# Patient Record
Sex: Female | Born: 2000 | Hispanic: No | Marital: Single | Smoking: Never smoker
Health system: Southern US, Community
[De-identification: ages and names within clinical notes are randomized; demographics above are authoritative.]

---
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
## 2020-09-07 IMAGING — DX DG HAND 2V*L*
2 series · 2 of 2 positions shown · non-contrast
Comparison: None.

CLINICAL DATA: Laceration with glass at the fourth and fifth
digits.

EXAM:
LEFT HAND - 2 VIEW

[hand pa]
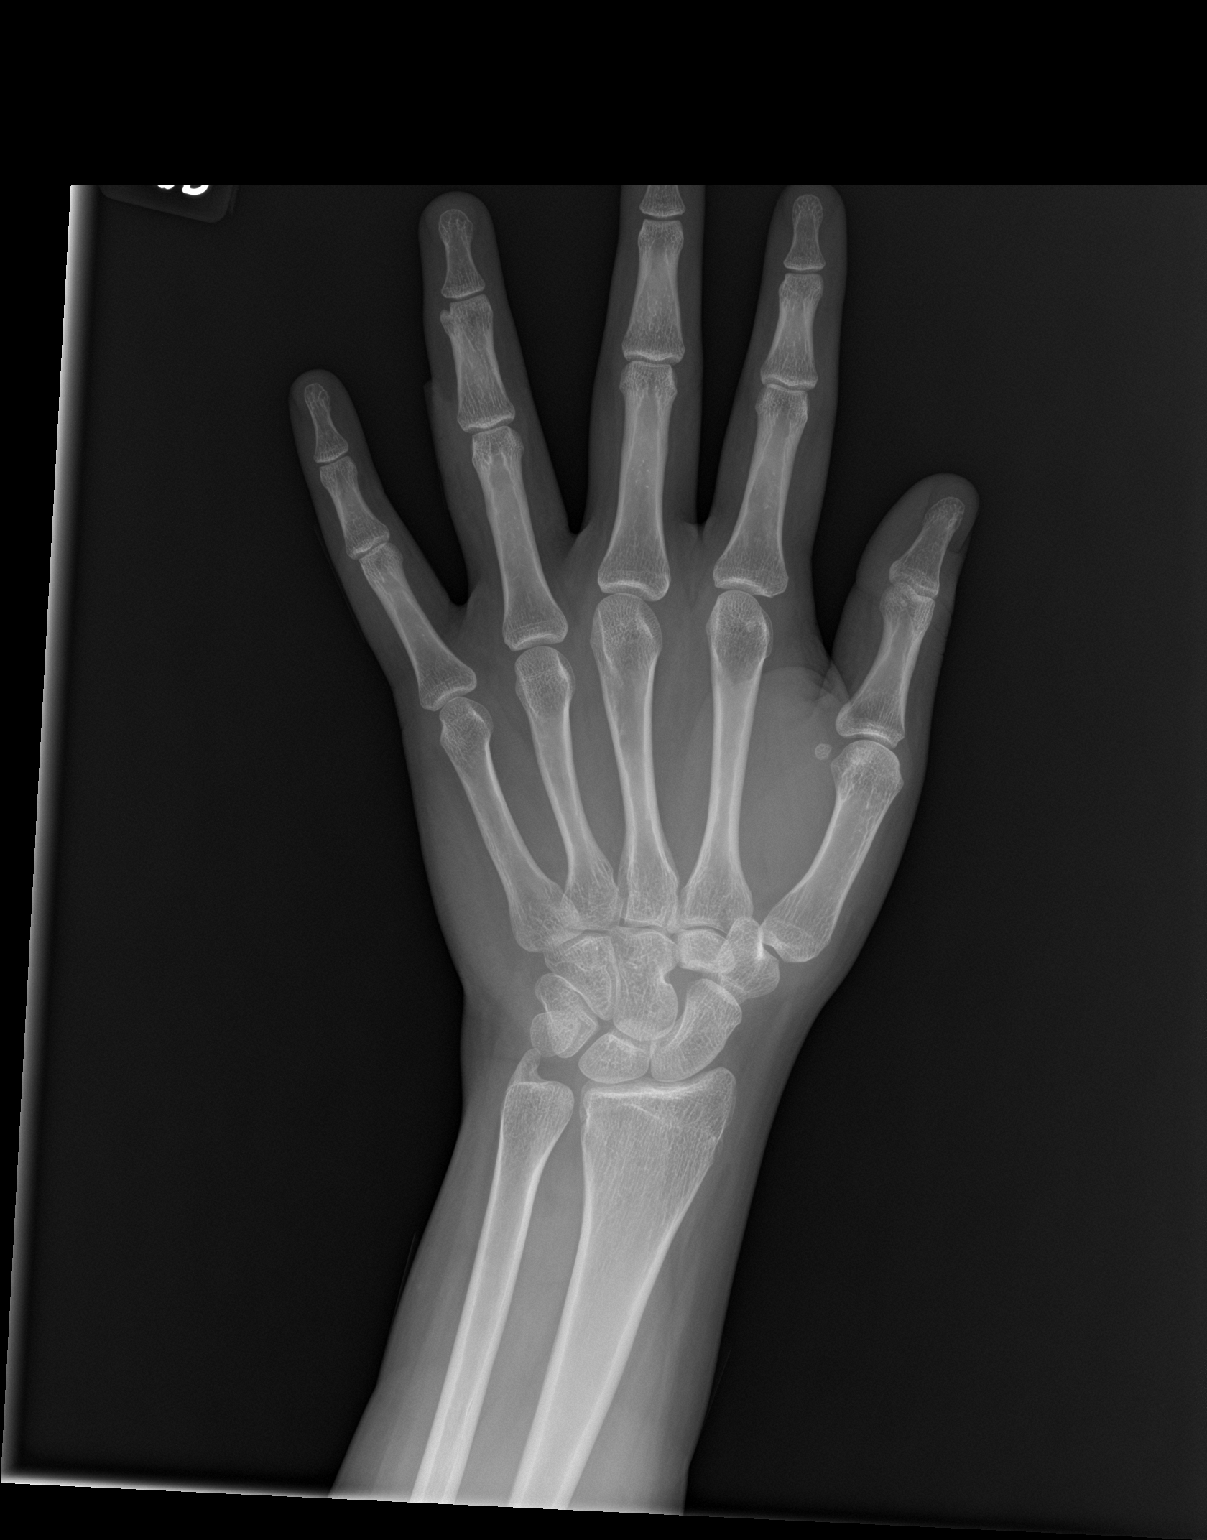

[hand lat]
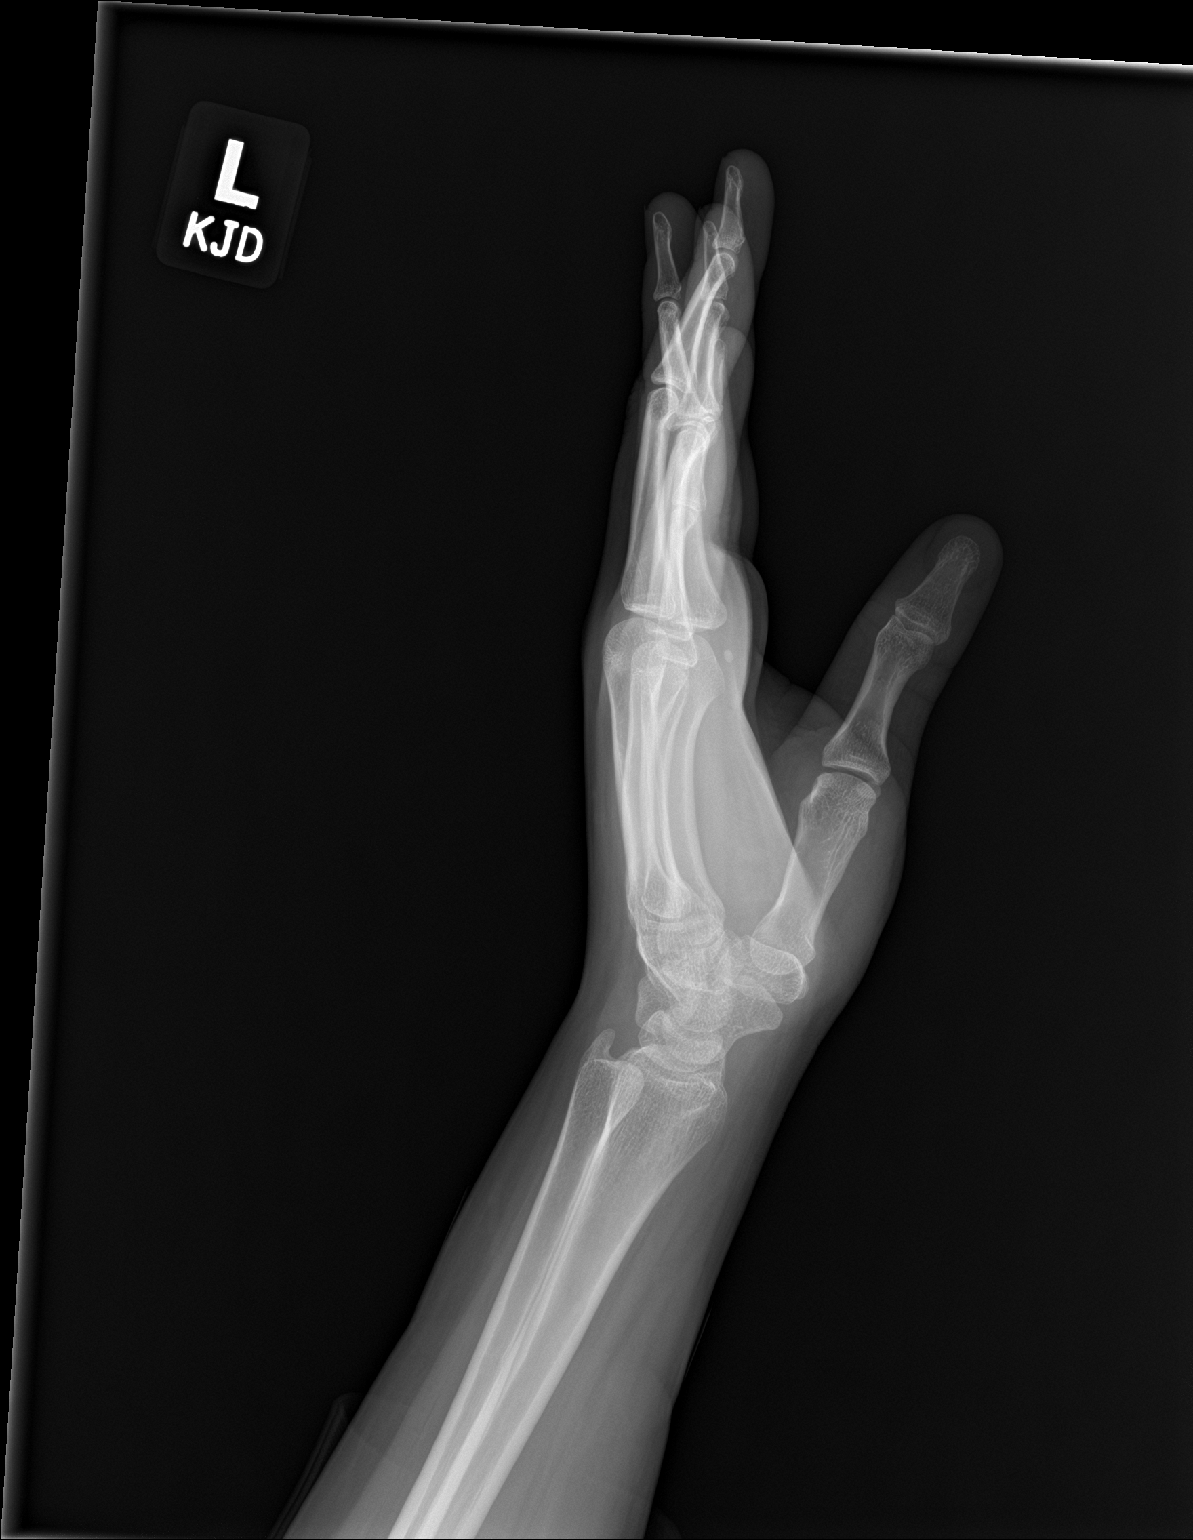

[2 of 2 positions shown; findings below may reference images not displayed]

FINDINGS: There is soft tissue swelling and irregularity along the ulnar
aspect of the fourth digit and more volar aspect of the fifth digit.
No radiopaque foreign body is seen. Corticated bony excrescence at
the head of the fourth middle phalanx is likely developmental or
remote posttraumatic in nature. No acute fracture or malalignment.
IMPRESSION: Soft tissue swelling and irregularity compatible with laceration
along the ulnar aspect of the fourth digit and volar fifth digit.

No radiopaque foreign body.

No acute osseous abnormality.
